# Patient Record
Sex: Male | Born: 1995 | Race: White | Hispanic: No | Marital: Single | Smoking: Never smoker
Health system: Southern US, Community
[De-identification: ages and names within clinical notes are randomized; demographics above are authoritative.]

## PROBLEM LIST (undated history)

## (undated) DIAGNOSIS — S060XAA Concussion with loss of consciousness status unknown, initial encounter: Secondary | ICD-10-CM

## (undated) DIAGNOSIS — S060X9A Concussion with loss of consciousness of unspecified duration, initial encounter: Secondary | ICD-10-CM

## (undated) DIAGNOSIS — R51 Headache: Secondary | ICD-10-CM

## (undated) HISTORY — PX: APPENDECTOMY: SHX54

## (undated) HISTORY — DX: Headache: R51

---
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
## 2018-04-04 ENCOUNTER — Ambulatory Visit (INDEPENDENT_AMBULATORY_CARE_PROVIDER_SITE_OTHER): Payer: 59

## 2018-04-04 ENCOUNTER — Ambulatory Visit
Admission: EM | Admit: 2018-04-04 | Discharge: 2018-04-04 | Disposition: A | Discharge status: Home / Self Care | Payer: 59 | Attending: Family Medicine | Admitting: Family Medicine

## 2018-04-04 ENCOUNTER — Other Ambulatory Visit: Payer: Self-pay

## 2018-04-04 ENCOUNTER — Encounter: Payer: Self-pay | Admitting: Emergency Medicine

## 2018-04-04 DIAGNOSIS — R51 Headache: Secondary | ICD-10-CM | POA: Diagnosis not present

## 2018-04-04 DIAGNOSIS — R0789 Other chest pain: Secondary | ICD-10-CM

## 2018-04-04 DIAGNOSIS — M791 Myalgia, unspecified site: Secondary | ICD-10-CM | POA: Insufficient documentation

## 2018-04-04 DIAGNOSIS — I498 Other specified cardiac arrhythmias: Secondary | ICD-10-CM | POA: Diagnosis not present

## 2018-04-04 DIAGNOSIS — R079 Chest pain, unspecified: Secondary | ICD-10-CM | POA: Diagnosis not present

## 2018-04-04 HISTORY — DX: Concussion with loss of consciousness status unknown, initial encounter: S06.0XAA

## 2018-04-04 HISTORY — DX: Concussion with loss of consciousness of unspecified duration, initial encounter: S06.0X9A

## 2018-04-04 NOTE — ED Triage Notes (Signed)
Patient c/o mid sternal chest pain that started yesterday afternoon. Pain subsided and then returned this morning. Patient states mild pain throughout the day and pain increased again this afternoon.

## 2018-04-04 NOTE — Discharge Instructions (Addendum)
Rest. Drink plenty of fluids.  No strenuous activity this week.  Follow up with your primary care physician closely for any continued complaints.  Return to Urgent care for new or worsening concerns.

## 2018-04-04 NOTE — ED Provider Notes (Signed)
[facility] ____________________________________________  Time seen: Approximately 6:30 PM  I have reviewed the triage vital signs and the nursing notes.   HISTORY  Chief Complaint Muscle Pain  HPI Patrick Mcguire is a 22 y.o. male presenting for evaluation of chest pain intermittently present since yesterday.  Patient denies any current chest pain.  Reports he noticed pain yesterday afternoon while driving home describing as a aching midsternal pain that lasted for a few minutes then resolved.  States since this morning he has intermittently felt brief and very mild recurrences of the same pain in the same location that would come and go without known trigger.  Denies any aggravating or alleviating factors.  States he did rub the area but unsure if this made any difference.  Denies any changes with lying flat, sitting up or leaning forward.  Again denies any pain at this time.  Denies any accompanying palpitations, shortness of breath, syncope, near syncope, weakness, fatigue, fevers, rash.  Denies any current or recent sickness, sore throat, runny nose, nasal congestion or cough.  Does report just prior to this onset on Monday he participated in a different exercise class in which was much more vigorous and cardio than his typical exercise.  States during the class he was doing a lot of quick cardio and changes including rowing on a row machine and pulling, as well as planks.  Denies any fall or direct trauma to chest.  Denies any paresthesias or pain radiation, extremity complaints, belching, nausea, vomiting.  Reports that he did have a headache yesterday, but states this only happened after staring at a computer screen for extended periods of time and resolved last night and has not reoccurred.  States today his stomach has bothered him some this evening, but states he feels that that is because he is hungry.  Did take ibuprofen and acid reflux yesterday without any change. Denies  recent sickness. Denies recent antibiotic use.  Denies smoking, vaping or any drug use.  No recent travel or immobilization.  Jackelyn Poling, MD: PCP   Past Medical History:  Diagnosis Date  . Concussion   . Headache(784.0)    Mother reports that child had one single syncopal episode approximately 5 years ago when he was at school, reports he was seen by cardiology and had a negative evaluation including negative echo and EKGs.  Reports had no other syncopal events or concern for cardiac issue.  Mother states that negative Wolff-Parkinson-White.  There are no active problems to display for this patient.   Past Surgical History:  Procedure Laterality Date  . APPENDECTOMY       No current facility-administered medications for this encounter.   Current Outpatient Medications:  .  ibuprofen (ADVIL,MOTRIN) 200 MG tablet, Take 200 mg by mouth every 6 (six) hours as needed for pain. 3 to 4 by mouth as needed., Disp: , Rfl:   Allergies Patient has no known allergies.  family history Paternal grandfather cardiac issues, but reports after smoking. No other family cardiac history.  Social History Social History   Tobacco Use  . Smoking status: Never Smoker  . Smokeless tobacco: Never Used  Substance Use Topics  . Alcohol use: Yes    Comment: social  . Drug use: No    Review of Systems Constitutional: No fever/chills Eyes: No visual changes. ENT: No sore throat. Cardiovascular: Positive intermittent chest pain. Respiratory: Denies shortness of breath. Gastrointestinal: No abdominal pain.  No nausea, no vomiting.  No diarrhea.  No  constipation. Genitourinary: Negative for dysuria. Musculoskeletal: Negative for back pain. Skin: Negative for rash. Neurological: Negative for focal weakness or numbness.   ____________________________________________   PHYSICAL EXAM:  VITAL SIGNS: ED Triage Vitals  Enc Vitals Group     BP 04/04/18 1806 (!) 145/76     Pulse Rate  04/04/18 1806 (!) 57     Resp 04/04/18 1806 16     Temp 04/04/18 1806 98.3 F (36.8 C)     Temp Source 04/04/18 1806 Oral     SpO2 04/04/18 1806 100 %     Weight 04/04/18 1803 190 lb (86.2 kg)     Height 04/04/18 1803 5\' 9"  (1.753 m)     Head Circumference --      Peak Flow --      Pain Score 04/04/18 1803 1     Pain Loc --      Pain Edu? --      Excl. in GC? --     Constitutional: Alert and oriented. Well appearing and in no acute distress. Eyes: Conjunctivae are normal.  ENT      Head: Normocephalic and atraumatic.      Nose: No congestion/rhinnorhea.      Mouth/Throat: Mucous membranes are moist.Oropharynx non-erythematous. Neck: No stridor. Supple without meningismus.  Hematological/Lymphatic/Immunilogical: No cervical lymphadenopathy. Cardiovascular: Normal rate, regular rhythm. Grossly normal heart sounds.  Good peripheral circulation. Respiratory: Normal respiratory effort without tachypnea nor retractions. Breath sounds are clear and equal bilaterally. No wheezes, rales, rhonchi. Gastrointestinal: Soft and nontender. No distention.No CVA tenderness. Musculoskeletal:  No midline cervical, thoracic or lumbar tenderness to palpation.  Chest nontender to palpation. Neurologic:  Normal speech and language. No gross focal neurologic deficits are appreciated. Speech is normal. No gait instability.  Skin:  Skin is warm, dry and intact. No rash noted. Psychiatric: Mood and affect are normal. Speech and behavior are normal. Patient exhibits appropriate insight and judgment   ___________________________________________   LABS (all labs ordered are listed, but only abnormal results are displayed)  Labs Reviewed - No data to display ____________________________________________  EKG  ED ECG REPORT I, Renford Dills, the attending provider, personally viewed and interpreted this ECG.   Date: 04/04/2018  EKG Time: 1813  Rate: 59  Rhythm:sinus bradycardia with sinus  arrhythmia  Axis: normal  Intervals:none  ST&T Change: none noted.  No previous EKG available for comparison.  ____________________________________________  RADIOLOGY  Dg Chest 2 View  Result Date: 04/04/2018 CLINICAL DATA:  Midsternal chest pain for a day. Recent strenuous workout. EXAM: CHEST - 2 VIEW COMPARISON:  None. FINDINGS: Cardiomediastinal silhouette is normal. No pleural effusions or focal consolidations. Trachea projects midline and there is no pneumothorax. Soft tissue planes and included osseous structures are non-suspicious. IMPRESSION: Normal chest. Electronically Signed   By: Awilda Metro M.D.   On: 04/04/2018 18:55   ____________________________________________   PROCEDURES Procedures   INITIAL IMPRESSION / ASSESSMENT AND PLAN / ED COURSE  Pertinent labs & imaging results that were available during my care of the patient were reviewed by me and considered in my medical decision making (see chart for details).  Very well-appearing patient.  No acute distress.  EKG well-appearing.  Chest x-ray negative.  Patient denies pain at this time.  Suspect musculoskeletal stress and strain from recent change in exercise including rowing and planks.  Encourage rest and supportive care.  Discussed strict follow-up and reevaluation concerns.   Discussed follow up with Primary care physician this week. Discussed follow up and  return parameters including no resolution or any worsening concerns. Patient verbalized understanding and agreed to plan.   ____________________________________________   FINAL CLINICAL IMPRESSION(S) / ED DIAGNOSES  Final diagnoses:  Atypical chest pain     ED Discharge Orders    None       Note: This dictation was prepared with Dragon dictation along with smaller phrase technology. Any transcriptional errors that result from this process are unintentional.         Renford DillsMiller, Alvine Mostafa, NP 04/04/18 2026

## 2018-10-01 ENCOUNTER — Encounter: Payer: Self-pay | Admitting: Emergency Medicine

## 2018-10-01 ENCOUNTER — Ambulatory Visit
Admission: EM | Admit: 2018-10-01 | Discharge: 2018-10-01 | Disposition: A | Discharge status: Home / Self Care | Payer: 59 | Attending: Family Medicine | Admitting: Family Medicine

## 2018-10-01 ENCOUNTER — Other Ambulatory Visit: Payer: Self-pay

## 2018-10-01 DIAGNOSIS — J111 Influenza due to unidentified influenza virus with other respiratory manifestations: Secondary | ICD-10-CM

## 2018-10-01 DIAGNOSIS — R05 Cough: Secondary | ICD-10-CM | POA: Diagnosis not present

## 2018-10-01 DIAGNOSIS — R69 Illness, unspecified: Principal | ICD-10-CM

## 2018-10-01 DIAGNOSIS — R0981 Nasal congestion: Secondary | ICD-10-CM | POA: Diagnosis not present

## 2018-10-01 LAB — RAPID INFLUENZA A&B ANTIGENS
Influenza A (ARMC): NEGATIVE
Influenza B (ARMC): NEGATIVE

## 2018-10-01 LAB — RAPID STREP SCREEN (MED CTR MEBANE ONLY): STREPTOCOCCUS, GROUP A SCREEN (DIRECT): NEGATIVE

## 2018-10-01 MED ORDER — BALOXAVIR MARBOXIL(80 MG DOSE) 2 X 40 MG PO TBPK: 80.0000 mg | ORAL_TABLET | Freq: Once | ORAL | 0 refills | Status: AC

## 2018-10-01 NOTE — ED Provider Notes (Signed)
[facility] ____________________________________________  Time seen: Approximately 2:56 PM  I have reviewed the triage vital signs and the nursing notes.   HISTORY  Chief Complaint Cough; Generalized Body Aches; and Headache   HPI Patrick Mcguire is a 23 y.o. male presenting for evaluation of nasal congestion, cough and body aches since yesterday morning.  States sore throat was initially present, but has since resolved.  States woke up in the middle the night and his temperature was 101.  Did take NyQuil and DayQuil, last took this morning.  Some recent sick contacts at home and work.  Denies chest pain or shortness of breath or abdominal pain.  Overall continues to eat and drink well.  Denies other complaints.  Jackelyn Poling, MD: PCP    Past Medical History:  Diagnosis Date  . Concussion   . Headache(784.0)     There are no active problems to display for this patient.   Past Surgical History:  Procedure Laterality Date  . APPENDECTOMY       No current facility-administered medications for this encounter.   Current Outpatient Medications:  .  Baloxavir Marboxil,80 MG Dose, (XOFLUZA) 2 x 40 MG TBPK, Take 80 mg by mouth once for 1 dose., Disp: 2 each, Rfl: 0  Allergies Patient has no known allergies.  History reviewed. No pertinent family history.  Social History Social History   Tobacco Use  . Smoking status: Never Smoker  . Smokeless tobacco: Never Used  Substance Use Topics  . Alcohol use: Yes    Comment: social  . Drug use: No    Review of Systems Constitutional: positive fever.  ENT: AS above. Cardiovascular: Denies chest pain. Respiratory: Denies shortness of breath. Gastrointestinal: No abdominal pain.  Musculoskeletal: Negative for back pain. Skin: Negative for rash.   ____________________________________________   PHYSICAL EXAM:  VITAL SIGNS: ED Triage Vitals  Enc Vitals Group     BP 10/01/18 0825 138/85     Pulse  Rate 10/01/18 0825 95     Resp 10/01/18 0825 18     Temp 10/01/18 0825 99.2 F (37.3 C)     Temp Source 10/01/18 0825 Oral     SpO2 10/01/18 0825 97 %     Weight 10/01/18 0824 190 lb (86.2 kg)     Height 10/01/18 0824 5\' 10"  (1.778 m)     Head Circumference --      Peak Flow --      Pain Score 10/01/18 0824 7     Pain Loc --      Pain Edu? --      Excl. in GC? --     Constitutional: Alert and oriented. Well appearing and in no acute distress. Eyes: Conjunctivae are normal.  Head: Atraumatic. No sinus tenderness to palpation. No swelling. No erythema.  Ears: no erythema, normal TMs bilaterally.   Nose:Nasal congestion   Mouth/Throat: Mucous membranes are moist. No pharyngeal erythema. No tonsillar swelling or exudate.  Neck: No stridor.  No cervical spine tenderness to palpation. Hematological/Lymphatic/Immunilogical: No cervical lymphadenopathy. Cardiovascular: Normal rate, regular rhythm. Grossly normal heart sounds.  Good peripheral circulation. Respiratory: Normal respiratory effort.  No retractions. No wheezes, rales or rhonchi. Good air movement.  Musculoskeletal: Ambulatory with steady gait.  Neurologic:  Normal speech and language. No gait instability. Skin:  Skin appears warm, dry and intact. No rash noted. Psychiatric: Mood and affect are normal. Speech and behavior are normal.  ___________________________________________   LABS (all labs ordered are listed, but  only abnormal results are displayed)  Labs Reviewed  RAPID INFLUENZA A&B ANTIGENS ([facility] ONLY)  RAPID STREP SCREEN ([facility] ONLY)  CULTURE, GROUP A STREP [facility])     PROCEDURES Procedures    INITIAL IMPRESSION / ASSESSMENT AND PLAN / ED COURSE  Pertinent labs & imaging results that were available during my care of the patient were reviewed by me and considered in my medical decision making (see chart for details).  Well-appearing patient.  No acute distress.  Suspect influenza-like illness.   Discussed treatment options.  Will treat with Xofluza.  Encourage continue Tylenol, ibuprofen, supportive care.  Work note given.Discussed indication, risks and benefits of medications with patient.  Discussed follow up with Primary care physician this week. Discussed follow up and return parameters including no resolution or any worsening concerns. Patient verbalized understanding and agreed to plan.   ____________________________________________   FINAL CLINICAL IMPRESSION(S) / ED DIAGNOSES  Final diagnoses:  Influenza-like illness     ED Discharge Orders         Ordered    Baloxavir Marboxil,80 MG Dose, (XOFLUZA) 2 x 40 MG TBPK   Once     10/01/18 0854           Note: This dictation was prepared with Dragon dictation along with smaller phrase technology. Any transcriptional errors that result from this process are unintentional.         Renford Dills, NP 10/01/18 1458

## 2018-10-01 NOTE — ED Triage Notes (Signed)
Patient c/o cough, congestion, fever, body aches, headache that started yesterday morning. Max temp 101.0. Patient has been taking Dayquil and Nyquil for his symptoms.

## 2018-10-01 NOTE — Discharge Instructions (Signed)
Take medication as prescribed. Rest. Drink plenty of fluids. Over the counter tylenol and ibuprofen as discussed.   Follow up with your primary care physician this week as needed. Return to Urgent care for new or worsening concerns.

## 2018-10-04 LAB — CULTURE, GROUP A STREP (THRC)

## 2018-12-26 IMAGING — CR DG CHEST 2V
2 series · 2 of 2 positions shown · non-contrast
Comparison: None.

CLINICAL DATA: Midsternal chest pain for a day. Recent strenuous
workout.

EXAM:
CHEST - 2 VIEW

[chest pa]
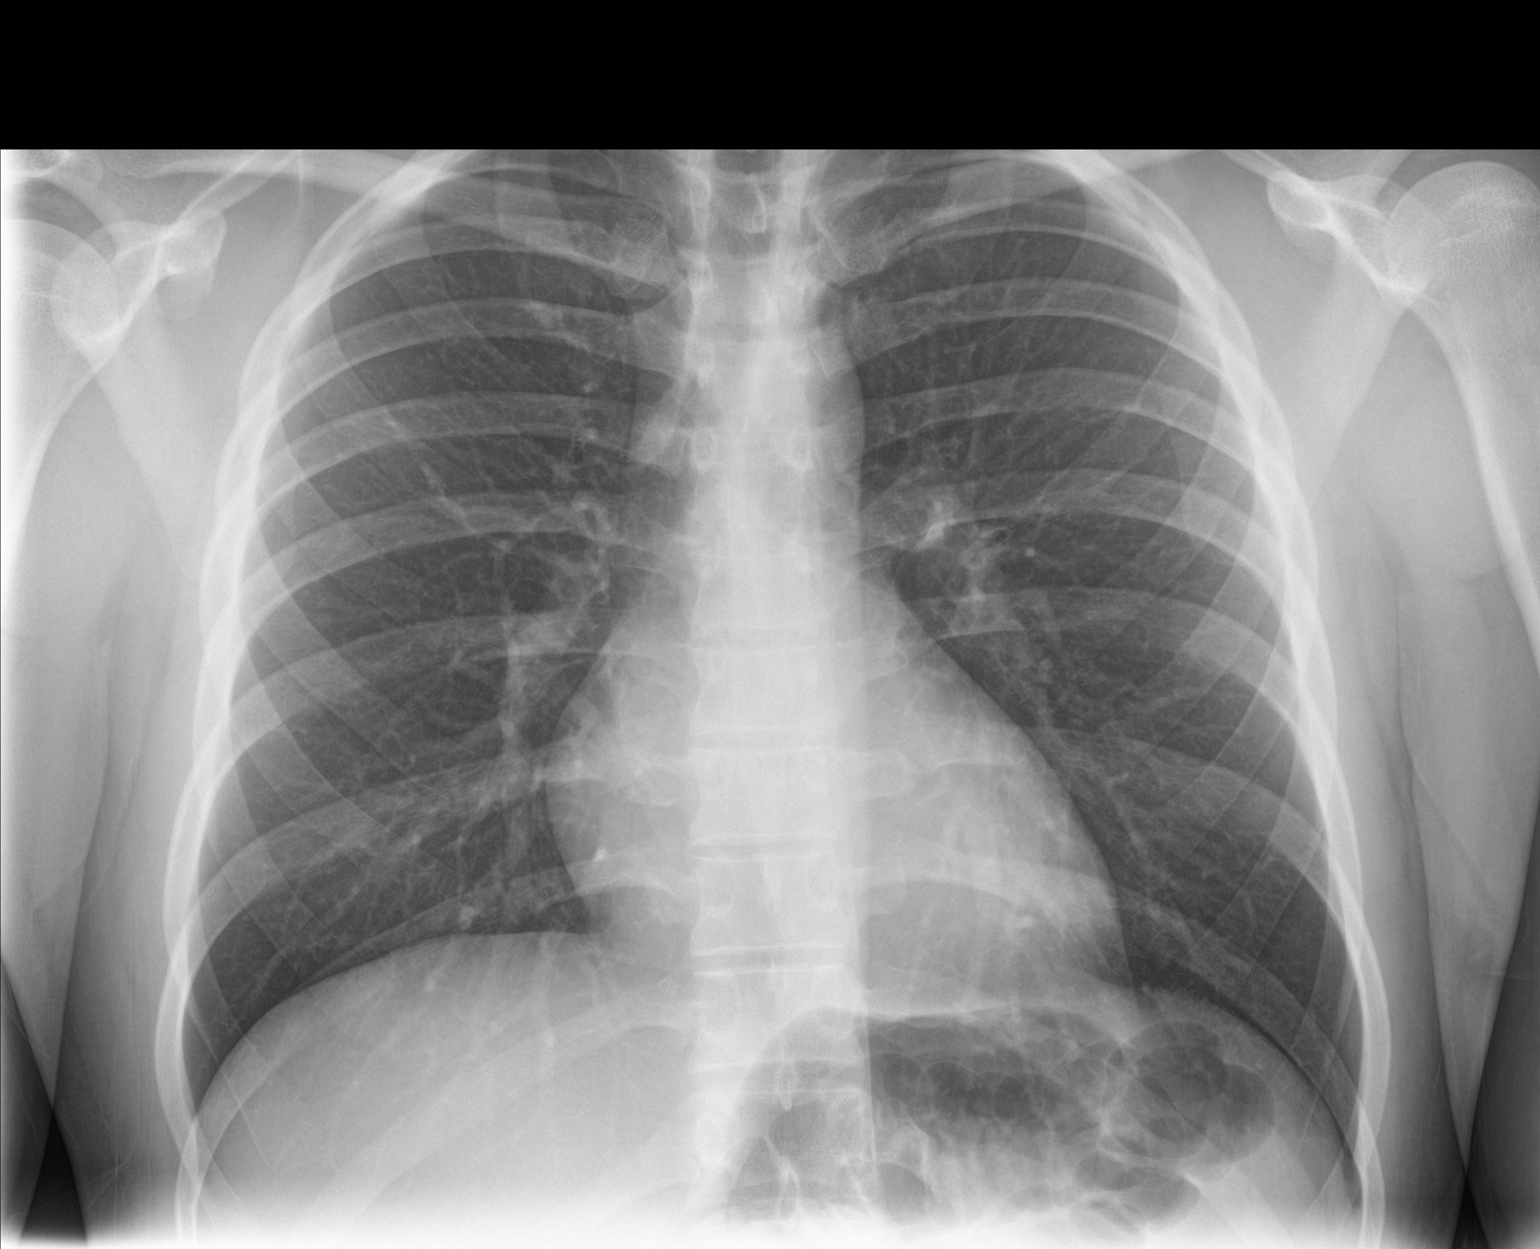

[chest lat]
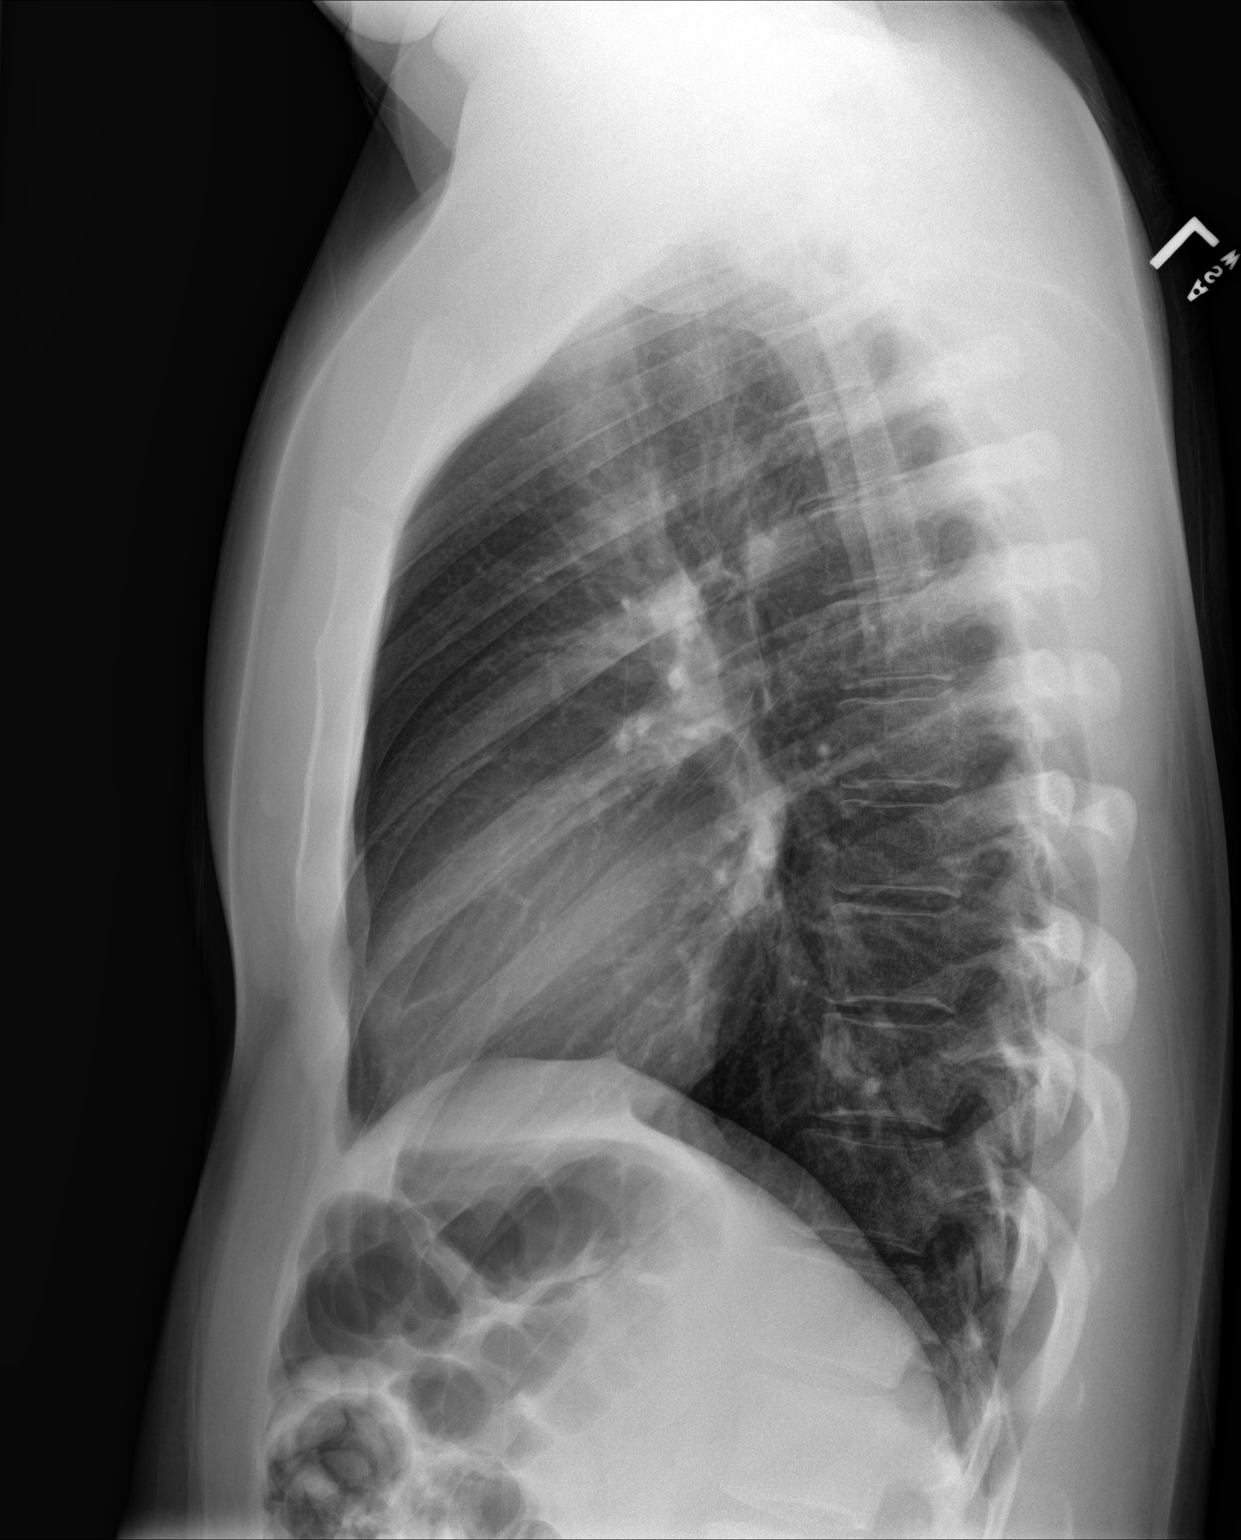

[2 of 2 positions shown; findings below may reference images not displayed]

FINDINGS: Cardiomediastinal silhouette is normal. No pleural effusions or
focal consolidations. Trachea projects midline and there is no
pneumothorax. Soft tissue planes and included osseous structures are
non-suspicious.
IMPRESSION: Normal chest.
